# Patient Record
Sex: Male | Born: 1980 | Race: White | Hispanic: No | Marital: Married | Smoking: Never smoker
Health system: Southern US, Community
[De-identification: ages and names within clinical notes are randomized; demographics above are authoritative.]

## PROBLEM LIST (undated history)

## (undated) DIAGNOSIS — G629 Polyneuropathy, unspecified: Secondary | ICD-10-CM

---

## 1999-04-28 ENCOUNTER — Emergency Department (HOSPITAL_COMMUNITY): Admission: EM | Admit: 1999-04-28 | Discharge: 1999-04-28 | Payer: Self-pay | Admitting: *Deleted

## 1999-04-30 ENCOUNTER — Emergency Department (HOSPITAL_COMMUNITY): Admission: EM | Admit: 1999-04-30 | Discharge: 1999-04-30 | Payer: Self-pay | Admitting: *Deleted

## 2003-03-10 ENCOUNTER — Encounter: Payer: Self-pay | Admitting: Emergency Medicine

## 2003-03-10 ENCOUNTER — Emergency Department (HOSPITAL_COMMUNITY): Admission: EM | Admit: 2003-03-10 | Discharge: 2003-03-10 | Payer: Self-pay | Admitting: Emergency Medicine

## 2006-11-22 ENCOUNTER — Encounter: Admission: RE | Admit: 2006-11-22 | Discharge: 2006-11-22 | Payer: Self-pay | Admitting: Orthopedic Surgery

## 2006-11-23 ENCOUNTER — Encounter: Admission: RE | Admit: 2006-11-23 | Discharge: 2006-11-23 | Payer: Self-pay | Admitting: Orthopedic Surgery

## 2007-06-15 ENCOUNTER — Emergency Department (HOSPITAL_COMMUNITY): Admission: EM | Admit: 2007-06-15 | Discharge: 2007-06-15 | Payer: Self-pay | Admitting: Emergency Medicine

## 2008-03-05 ENCOUNTER — Emergency Department (HOSPITAL_COMMUNITY): Admission: EM | Admit: 2008-03-05 | Discharge: 2008-03-05 | Payer: Self-pay | Admitting: Family Medicine

## 2008-03-05 ENCOUNTER — Ambulatory Visit (HOSPITAL_COMMUNITY): Admission: RE | Admit: 2008-03-05 | Discharge: 2008-03-05 | Payer: Self-pay | Admitting: Family Medicine

## 2008-04-20 ENCOUNTER — Emergency Department (HOSPITAL_COMMUNITY): Admission: EM | Admit: 2008-04-20 | Discharge: 2008-04-20 | Payer: Self-pay | Admitting: Emergency Medicine

## 2008-07-23 ENCOUNTER — Emergency Department (HOSPITAL_COMMUNITY): Admission: EM | Admit: 2008-07-23 | Discharge: 2008-07-23 | Payer: Self-pay | Admitting: Family Medicine

## 2008-08-06 ENCOUNTER — Emergency Department (HOSPITAL_COMMUNITY): Admission: EM | Admit: 2008-08-06 | Discharge: 2008-08-06 | Payer: Self-pay | Admitting: Family Medicine

## 2008-08-18 ENCOUNTER — Emergency Department (HOSPITAL_COMMUNITY): Admission: EM | Admit: 2008-08-18 | Discharge: 2008-08-18 | Payer: Self-pay | Admitting: Emergency Medicine

## 2008-09-09 ENCOUNTER — Emergency Department (HOSPITAL_COMMUNITY): Admission: EM | Admit: 2008-09-09 | Discharge: 2008-09-09 | Payer: Self-pay | Admitting: Emergency Medicine

## 2008-09-24 ENCOUNTER — Emergency Department (HOSPITAL_COMMUNITY): Admission: EM | Admit: 2008-09-24 | Discharge: 2008-09-24 | Payer: Self-pay | Admitting: Emergency Medicine

## 2008-11-07 ENCOUNTER — Emergency Department (HOSPITAL_COMMUNITY): Admission: EM | Admit: 2008-11-07 | Discharge: 2008-11-07 | Payer: Self-pay | Admitting: Emergency Medicine

## 2008-12-23 ENCOUNTER — Encounter: Admission: RE | Admit: 2008-12-23 | Discharge: 2009-01-01 | Payer: Self-pay | Admitting: Family Medicine

## 2009-01-02 ENCOUNTER — Encounter: Admission: RE | Admit: 2009-01-02 | Discharge: 2009-01-02 | Payer: Self-pay | Admitting: Family Medicine

## 2009-03-30 ENCOUNTER — Emergency Department (HOSPITAL_COMMUNITY): Admission: EM | Admit: 2009-03-30 | Discharge: 2009-03-30 | Payer: Self-pay | Admitting: Family Medicine

## 2009-04-11 ENCOUNTER — Emergency Department (HOSPITAL_COMMUNITY): Admission: EM | Admit: 2009-04-11 | Discharge: 2009-04-11 | Payer: Self-pay | Admitting: Emergency Medicine

## 2009-05-23 ENCOUNTER — Emergency Department (HOSPITAL_COMMUNITY): Admission: EM | Admit: 2009-05-23 | Discharge: 2009-05-23 | Payer: Self-pay | Admitting: Emergency Medicine

## 2011-05-20 LAB — POCT URINALYSIS DIP (DEVICE)
Bilirubin Urine: NEGATIVE
Ketones, ur: NEGATIVE
Operator id: 200941
Protein, ur: NEGATIVE

## 2011-05-20 LAB — DIFFERENTIAL
Eosinophils Relative: 7 — ABNORMAL HIGH
Lymphocytes Relative: 26
Lymphs Abs: 2

## 2011-05-20 LAB — POCT I-STAT, CHEM 8
BUN: 12
Chloride: 104
Creatinine, Ser: 1.2
Potassium: 3.9
Sodium: 140
TCO2: 27

## 2011-05-20 LAB — CBC
HCT: 43.5
Platelets: 236
WBC: 7.7

## 2019-03-16 ENCOUNTER — Emergency Department (HOSPITAL_BASED_OUTPATIENT_CLINIC_OR_DEPARTMENT_OTHER): Payer: Medicare (Managed Care)

## 2019-03-16 ENCOUNTER — Encounter (HOSPITAL_BASED_OUTPATIENT_CLINIC_OR_DEPARTMENT_OTHER): Payer: Self-pay | Admitting: *Deleted

## 2019-03-16 ENCOUNTER — Emergency Department (HOSPITAL_BASED_OUTPATIENT_CLINIC_OR_DEPARTMENT_OTHER)
Admission: EM | Admit: 2019-03-16 | Discharge: 2019-03-16 | Disposition: A | Discharge status: Home / Self Care | Payer: Medicare (Managed Care) | Attending: Emergency Medicine | Admitting: Emergency Medicine

## 2019-03-16 ENCOUNTER — Other Ambulatory Visit: Payer: Self-pay

## 2019-03-16 DIAGNOSIS — M79675 Pain in left toe(s): Secondary | ICD-10-CM | POA: Diagnosis present

## 2019-03-16 DIAGNOSIS — M109 Gout, unspecified: Secondary | ICD-10-CM | POA: Insufficient documentation

## 2019-03-16 HISTORY — DX: Polyneuropathy, unspecified: G62.9

## 2019-03-16 MED ORDER — COLCHICINE 0.6 MG PO TABS: 0.6000 mg | ORAL_TABLET | Freq: Every day | ORAL | 0 refills | Status: AC

## 2019-03-16 NOTE — ED Notes (Signed)
Patient transported to X-ray 

## 2019-03-16 NOTE — Discharge Instructions (Signed)
You were seen in the ED today for pain to your left big toe Your xray was negative for any obvious fractures  Symptoms are likely related to gout. Please take medication as prescribed. Please follow up with your PCP.

## 2019-03-16 NOTE — ED Provider Notes (Signed)
[facility] EMERGENCY DEPARTMENT Provider Note   CSN: [id number] Arrival date & time: 03/16/19  1538    History   Chief Complaint Chief Complaint  Patient presents with  . Foot Pain    HPI Edward Browning is a 38 y.o. male with PMHx ankylosing spondylitis and RA who presents to the ED complaining of sudden onset, constant, severe, left great toe pain that began yesterday. No known injury. Pt reports he was helping someone move while wearing flip flops yesterday but can't think of an instance where he injured his toe in anyway. A few hours later he started having pain to the area and it has become severe in nature today, prompting him to come to the ED. Pt has been taking 800 mg Ibuprofen PRN for pain with mild relief. Denies fever, chills, red streaking, drainage, bruising, or any other associated symptoms.       Past Medical History:  Diagnosis Date  . Neuropathy     There are no active problems to display for this patient.   History reviewed. No pertinent surgical history.      Home Medications    Prior to Admission medications   Medication Sig Start Date End Date Taking? Authorizing Provider  Adalimumab (HUMIRA [place]) Inject into the skin.   Yes [provider]  GABAPENTIN PO Take by mouth.   Yes [provider]  colchicine 0.6 MG tablet Take 1 tablet (0.6 mg total) by mouth daily for 10 days. 03/16/19 03/26/19  Tanda RockersVenter, Araceli Coufal, PA-C    Family History No family history on file.  Social History Social History   Tobacco Use  . Smoking status: Never Smoker  . Smokeless tobacco: Never Used  Substance Use Topics  . Alcohol use: Yes    Frequency: Never  . Drug use: Yes    Types: Marijuana     Allergies   Voltaren [diclofenac sodium]   Review of Systems Review of Systems  Constitutional: Negative for chills and fever.  Musculoskeletal: Positive for arthralgias. Negative for joint swelling.  Skin: Negative for wound.      Physical Exam Updated Vital Signs BP (!) 123/92   Pulse 80   Temp 98.6 F (37 C) (Oral)   Resp 20   Ht 5\' 11"  (1.803 m)   Wt 112.5 kg   SpO2 100%   BMI 34.59 kg/m   Physical Exam Vitals signs and nursing note reviewed.  Constitutional:      Appearance: He is not ill-appearing.  HENT:     Head: Normocephalic and atraumatic.  Eyes:     Conjunctiva/sclera: Conjunctivae normal.  Cardiovascular:     Rate and Rhythm: Normal rate and regular rhythm.  Pulmonary:     Effort: Pulmonary effort is normal.     Breath sounds: Normal breath sounds.  Musculoskeletal:     Comments: Mild swelling and erythema noted to left great toe along MTP joint with exquisite TTP even with light touch; ROM intact although dorsiflexion and plantarflexion of great toe limited due to pain; sensation intact; cap refill < 2 seconds; good distal pulses  Skin:    General: Skin is warm and dry.     Coloration: Skin is not jaundiced.  Neurological:     Mental Status: He is alert.      ED Treatments / Results  Labs (all labs ordered are listed, but only abnormal results are displayed) Labs Reviewed - No data to display  EKG None  Radiology Dg Toe Great Left  Result Date: 03/16/2019 CLINICAL DATA:  Left great toe pain since yesterday.  No injury. EXAM: LEFT GREAT TOE COMPARISON:  None. FINDINGS: There is no evidence of fracture or dislocation. There is no evidence of arthropathy or other focal bone abnormality. Soft tissues are unremarkable. IMPRESSION: Negative. Electronically Signed   By: Marin Olp M.D.   On: 03/16/2019 16:20    Procedures Procedures (including critical care time)  Medications Ordered in ED Medications - No data to display   Initial Impression / Assessment and Plan / ED Course  I have reviewed the triage vital signs and the nursing notes.  Pertinent labs & imaging results that were available during my care of the patient were reviewed by me and considered in my medical  decision making (see chart for details).    38 year old male presenting to the ED with left great toe pain, no known injury. Toe has increased warmth and redness with TTP with light touch. Suspect gout today. Reports heavy red meat use and most recently heavy shellfish use. Xray obtained to rule out fracture, negative per my read as well as radiologist reading. Offered prednisone but pt reports side effects with it including irritability. Will write for Colchicine instead. Pt advised to follow up with his PCP. He is in agreement with plan at this time and stable for discharge home.       Final Clinical Impressions(s) / ED Diagnoses   Final diagnoses:  Acute gout involving toe of left foot, unspecified cause    ED Discharge Orders         Ordered    colchicine 0.6 MG tablet  Daily     03/16/19 1637           Eustaquio Maize, PA-C 03/16/19 2139    Charlesetta Shanks, MD 03/20/19 1005

## 2019-03-16 NOTE — ED Triage Notes (Signed)
Left great toe pain. Unknown injury.

## 2019-03-16 NOTE — ED Notes (Signed)
Pt discharged by float RN earlier in shift

## 2020-10-20 IMAGING — DX LEFT GREAT TOE
3 series · 3 of 3 positions shown · non-contrast
Comparison: None.

CLINICAL DATA: Left great toe pain since yesterday.  No injury.

EXAM:
LEFT GREAT TOE

[toe ap]
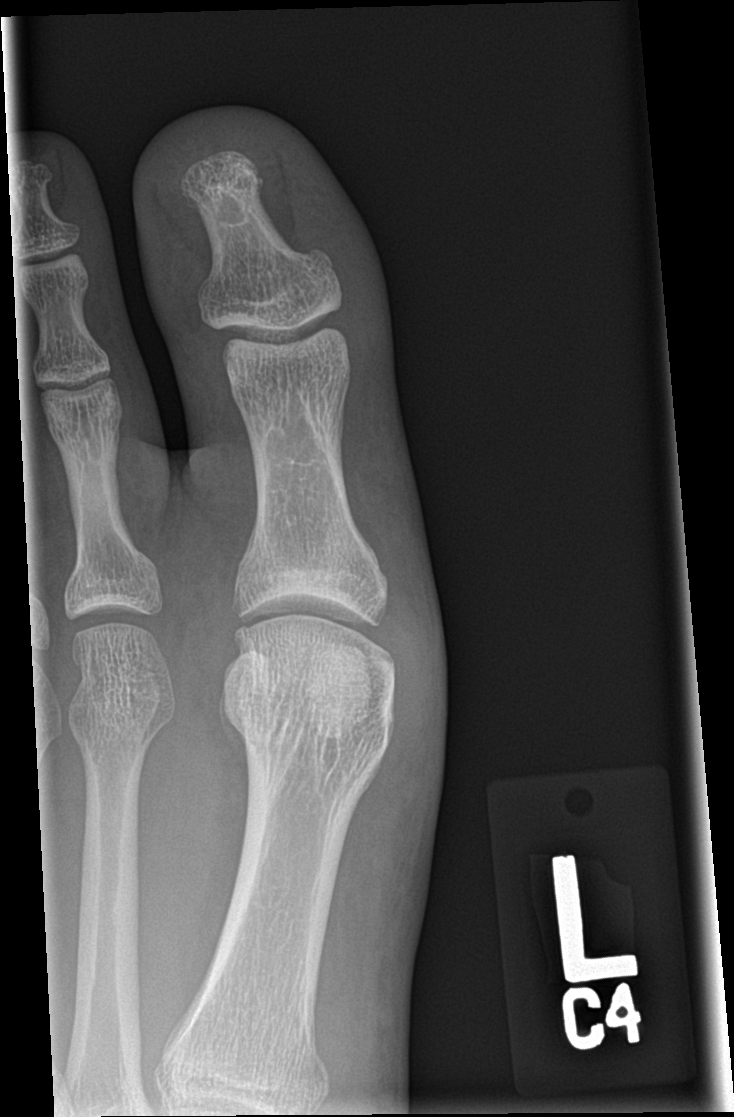

[toe lat]
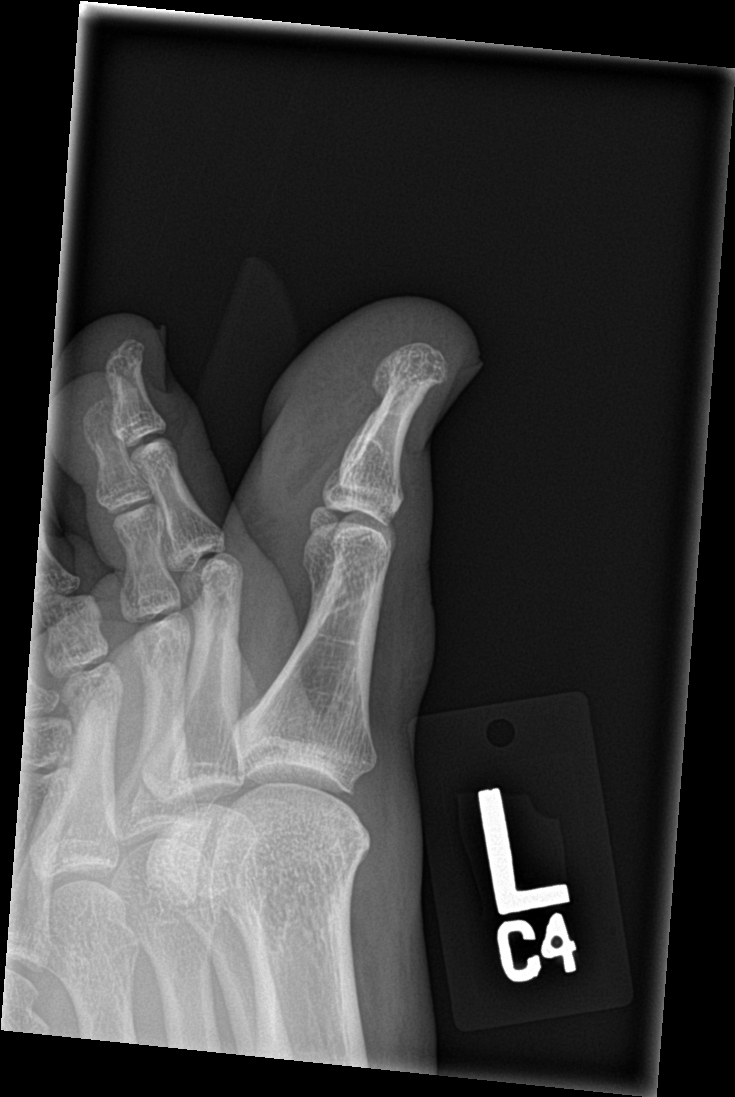

[toe obl]
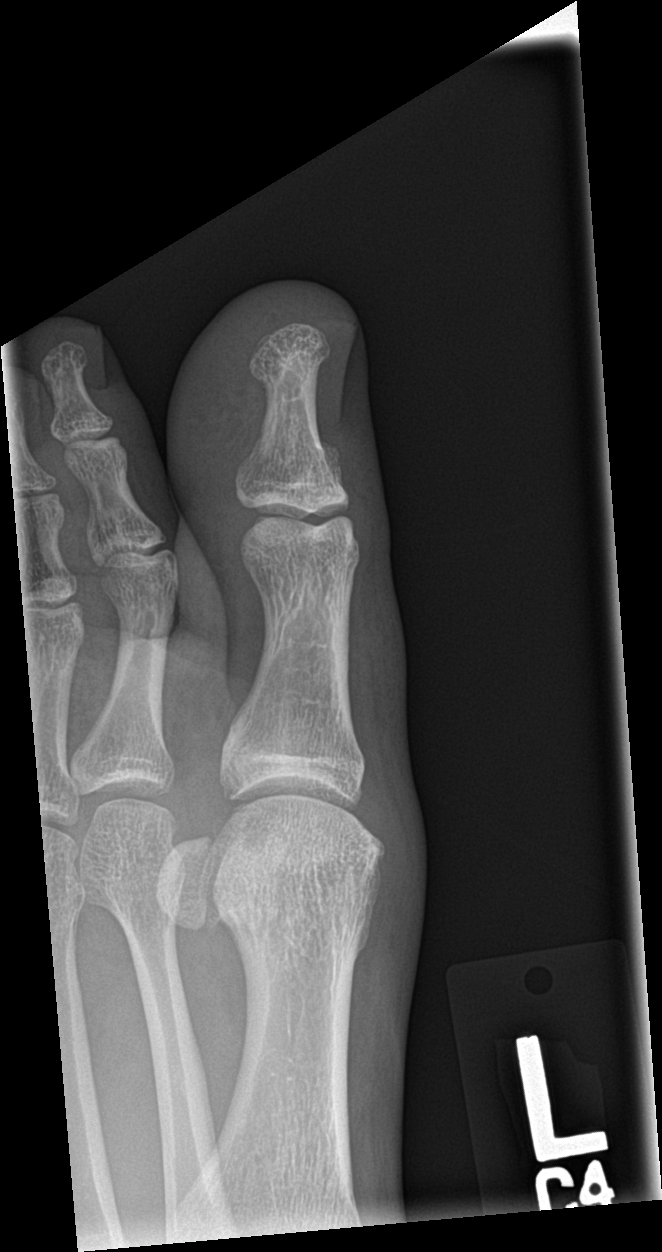

[3 of 3 positions shown; findings below may reference images not displayed]

FINDINGS: There is no evidence of fracture or dislocation. There is no
evidence of arthropathy or other focal bone abnormality. Soft
tissues are unremarkable.
IMPRESSION: Negative.
